# Patient Record
Sex: Male | Born: 1980 | Race: White | Hispanic: No
Health system: Southern US, Academic
[De-identification: ages and names within clinical notes are randomized; demographics above are authoritative.]

---

## 2019-06-30 ENCOUNTER — Other Ambulatory Visit: Payer: Self-pay

## 2019-06-30 ENCOUNTER — Emergency Department
Admission: EM | Admit: 2019-06-30 | Discharge: 2019-07-01 | Disposition: A | Discharge status: Home / Self Care | Payer: Self-pay | Attending: Emergency Medicine | Admitting: Emergency Medicine

## 2019-06-30 ENCOUNTER — Encounter: Payer: Self-pay | Admitting: Emergency Medicine

## 2019-06-30 DIAGNOSIS — Z79899 Other long term (current) drug therapy: Secondary | ICD-10-CM | POA: Insufficient documentation

## 2019-06-30 DIAGNOSIS — M542 Cervicalgia: Secondary | ICD-10-CM | POA: Insufficient documentation

## 2019-06-30 DIAGNOSIS — T7840XA Allergy, unspecified, initial encounter: Secondary | ICD-10-CM | POA: Insufficient documentation

## 2019-06-30 DIAGNOSIS — L03221 Cellulitis of neck: Secondary | ICD-10-CM | POA: Insufficient documentation

## 2019-06-30 DIAGNOSIS — F17200 Nicotine dependence, unspecified, uncomplicated: Secondary | ICD-10-CM | POA: Insufficient documentation

## 2019-06-30 LAB — COMPREHENSIVE METABOLIC PANEL
ALT: 13 U/L (ref 0–44)
AST: 16 U/L (ref 15–41)
Albumin: 4.5 g/dL (ref 3.5–5.0)
Alkaline Phosphatase: 53 U/L (ref 38–126)
Anion gap: 11 (ref 5–15)
BUN: 7 mg/dL (ref 6–20)
CO2: 27 mmol/L (ref 22–32)
Calcium: 9.6 mg/dL (ref 8.9–10.3)
Chloride: 101 mmol/L (ref 98–111)
Creatinine, Ser: 0.78 mg/dL (ref 0.61–1.24)
GFR calc Af Amer: >60 mL/min (ref >60)
GFR calc non Af Amer: >60 mL/min (ref >60)
Glucose, Bld: 96 mg/dL (ref 70–99)
Potassium: 3.5 mmol/L (ref 3.5–5.1)
Sodium: 139 mmol/L (ref 135–145)
Total Bilirubin: 0.8 mg/dL (ref 0.3–1.2)
Total Protein: 8.3 g/dL — ABNORMAL HIGH (ref 6.5–8.1)

## 2019-06-30 LAB — CBC
HCT: 39.6 % (ref 39.0–52.0)
Hemoglobin: 13.5 g/dL (ref 13.0–17.0)
MCH: 31.8 pg (ref 26.0–34.0)
MCHC: 34.1 g/dL (ref 30.0–36.0)
MCV: 93.2 fL (ref 80.0–100.0)
Platelets: 186 10*3/uL (ref 150–400)
RBC: 4.25 MIL/uL (ref 4.22–5.81)
RDW: 12.2 % (ref 11.5–15.5)
WBC: 11.9 10*3/uL — ABNORMAL HIGH (ref 4.0–10.5)
nRBC: 0 % (ref 0.0–0.2)

## 2019-06-30 LAB — LACTIC ACID, PLASMA: Lactic Acid, Venous: 1.4 mmol/L (ref 0.5–1.9)

## 2019-06-30 MED ORDER — HYDROCODONE-ACETAMINOPHEN 5-325 MG PO TABS
2.0000 | ORAL_TABLET | Freq: Once | ORAL | Status: AC
  Administered 2019-07-01: 2 via ORAL
  Filled 2019-06-30: qty 2

## 2019-06-30 MED ORDER — METHYLPREDNISOLONE SODIUM SUCC 125 MG IJ SOLR
125.0000 mg | Freq: Once | INTRAMUSCULAR | Status: AC
  Administered 2019-06-30: 22:00:00 125 mg via INTRAVENOUS
  Filled 2019-06-30: qty 2

## 2019-06-30 MED ORDER — SODIUM CHLORIDE 0.9 % IV BOLUS
1000.0000 mL | Freq: Once | INTRAVENOUS | Status: AC
  Administered 2019-06-30: 1000 mL via INTRAVENOUS

## 2019-06-30 MED ORDER — DIPHENHYDRAMINE HCL 50 MG/ML IJ SOLN
25.0000 mg | Freq: Once | INTRAMUSCULAR | Status: AC
  Administered 2019-06-30: 22:00:00 25 mg via INTRAVENOUS
  Filled 2019-06-30: qty 1

## 2019-06-30 MED ORDER — KETOROLAC TROMETHAMINE 30 MG/ML IJ SOLN
15.0000 mg | Freq: Once | INTRAMUSCULAR | Status: AC
  Administered 2019-06-30: 15 mg via INTRAVENOUS
  Filled 2019-06-30: qty 1

## 2019-06-30 MED ORDER — FAMOTIDINE IN NACL 20-0.9 MG/50ML-% IV SOLN
20.0000 mg | Freq: Once | INTRAVENOUS | Status: AC
  Administered 2019-06-30: 22:00:00 20 mg via INTRAVENOUS
  Filled 2019-06-30: qty 50

## 2019-06-30 MED ORDER — DIPHENHYDRAMINE HCL 50 MG/ML IJ SOLN: 25.0000 mg | Freq: Once | INTRAMUSCULAR | Status: DC

## 2019-06-30 NOTE — ED Notes (Signed)
Spoke with dr. Quentin Cornwall regarding pt complaint. Orders for labs received.

## 2019-06-30 NOTE — ED Notes (Signed)
Pt reports burning to the arm when benadryl given. Pt has whelps that appear over the arm distal to the site. Pt states that burning has improved. Lab unable to get blood cultures at this time.

## 2019-06-30 NOTE — ED Notes (Signed)
Unable to obtain any blood samples from IV. IV team consult placed.

## 2019-06-30 NOTE — ED Notes (Signed)
Attempted to draw blood from pt x2 without success Lattie Haw, RN notified.

## 2019-06-30 NOTE — ED Triage Notes (Addendum)
Pt presents to ED with pain, redness, and swelling to the left side of his neck/clavical area. Pt reports he noticed slight redness and pain with movement upon waking this morning. Pt states by lunch time symptoms had worsened significantly. Unsure if he was stung or bitten by an insect. No obvious knot or abscess noted during triage. Attempted to use benadryl cream and took otc benadryl and tylenol at home without relief.

## 2019-06-30 NOTE — ED Provider Notes (Signed)
[facility] Emergency Department Provider Note  ____________________________________________   First MD Initiated Contact with Patient 06/30/19 2148     (approximate)  I have reviewed the triage vital signs and the nursing notes.   HISTORY  Chief Complaint Neck Pain    HPI Michael Eaton is a 38 y.o. male  Here with swelling and pain to lef tneck. Pt states that he was outside working this morning when he pulled down a wasp nest. He felt a stinging sensation in his left lower neck/shoulder area. Since then, eh has had progressively worsening swelling, redness, and warmth along his left base of the neck. He has had no difficulty breathing, swallowing, or hoarseness. No abd pain, n/v/d. No h/o similar reactions. He states he may have had a small red area in the neck before the bite but eveyrthing was worse after it. No fever, chills, weight loss, night sweats. No h/o MRSA. No other complaitns. Pian is aching, throbbing, worse w/ any movement or palpation. No alleviating factors.        History reviewed. No pertinent past medical history.  There are no active problems to display for this patient.   History reviewed. No pertinent surgical history.  Prior to Admission medications   Medication Sig Start Date End Date Taking? Authorizing Provider  cephALEXin (KEFLEX) 500 MG capsule Take 1 capsule (500 mg total) by mouth 3 (three) times daily for 7 days. 07/01/19 07/08/19  Shaune Pollack, MD  EPINEPHrine 0.3 mg/0.3 mL IJ SOAJ injection Inject 0.3 mLs (0.3 mg total) into the muscle as needed for anaphylaxis. 07/01/19   Shaune Pollack, MD  ibuprofen (ADVIL) 600 MG tablet Take 1 tablet (600 mg total) by mouth every 8 (eight) hours as needed for moderate pain. 07/01/19   Shaune Pollack, MD  sulfamethoxazole-trimethoprim (BACTRIM DS) 800-160 MG tablet Take 1 tablet by mouth 2 (two) times daily for 7 days. 07/01/19 07/08/19  Shaune Pollack, MD  triamcinolone ointment  (KENALOG) 0.1 % Apply 1 application topically 2 (two) times daily for 7 days. 07/01/19 07/08/19  Shaune Pollack, MD    Allergies Patient has no known allergies.  No family history on file.  Social History Social History   Tobacco Use  . Smoking status: Current Some Day Smoker  . Smokeless tobacco: Current User    Types: Snuff  Substance Use Topics  . Alcohol use: Never    Frequency: Never  . Drug use: Never    Review of Systems  Review of Systems  Constitutional: Positive for fatigue. Negative for chills and fever.  HENT: Negative for sore throat.   Respiratory: Negative for shortness of breath.   Cardiovascular: Negative for chest pain.  Gastrointestinal: Negative for abdominal pain.  Genitourinary: Negative for flank pain.  Musculoskeletal: Positive for neck pain.  Skin: Positive for rash. Negative for wound.  Allergic/Immunologic: Negative for immunocompromised state.  Neurological: Negative for weakness and numbness.  Hematological: Does not bruise/bleed easily.     ____________________________________________  PHYSICAL EXAM:      VITAL SIGNS: ED Triage Vitals  Enc Vitals Group     BP 06/30/19 2007 129/68     Pulse Rate 06/30/19 2007 78     Resp 06/30/19 2007 20     Temp 06/30/19 2007 98.5 F (36.9 C)     Temp Source 06/30/19 2007 Oral     SpO2 06/30/19 2007 100 %     Weight 06/30/19 2008 157 lb (71.2 kg)     Height 06/30/19 2008 6' (  1.829 m)     Head Circumference --      Peak Flow --      Pain Score 06/30/19 2007 10     Pain Loc --      Pain Edu? --      Excl. in [place]? --      Physical Exam Vitals signs and nursing note reviewed.  Constitutional:      General: He is not in acute distress.    Appearance: He is well-developed.  HENT:     Head: Normocephalic and atraumatic.  Eyes:     Conjunctiva/sclera: Conjunctivae normal.  Neck:     Musculoskeletal: Neck supple.     Comments: Small bite mark to base of left neck overlying the medial clavicle,  with surrounding erythema and edema. Mild surrounding LAD appreciated. No fluctuance or drainage. Cardiovascular:     Rate and Rhythm: Normal rate and regular rhythm.     Heart sounds: Normal heart sounds. No murmur. No friction rub.  Pulmonary:     Effort: Pulmonary effort is normal. No respiratory distress.     Breath sounds: Normal breath sounds. No wheezing or rales.  Abdominal:     General: There is no distension.     Palpations: Abdomen is soft.     Tenderness: There is no abdominal tenderness.  Skin:    General: Skin is warm.     Capillary Refill: Capillary refill takes less than 2 seconds.  Neurological:     Mental Status: He is alert and oriented to person, place, and time.     Motor: No abnormal muscle tone.       ____________________________________________   LABS (all labs ordered are listed, but only abnormal results are displayed)  Labs Reviewed  COMPREHENSIVE METABOLIC PANEL - Abnormal; Notable for the following components:      Result Value   Total Protein 8.3 (*)    All other components within normal limits  CBC - Abnormal; Notable for the following components:   WBC 11.9 (*)    All other components within normal limits  CULTURE, BLOOD (ROUTINE X 2)  CULTURE, BLOOD (ROUTINE X 2)  LACTIC ACID, PLASMA  LACTIC ACID, PLASMA    ____________________________________________  EKG: None ________________________________________  RADIOLOGY All imaging, including plain films, CT scans, and ultrasounds, independently reviewed by me, and interpretations confirmed via formal radiology reads.  ED MD interpretation:   None  Official radiology report(s): No results found.  ____________________________________________  PROCEDURES   Procedure(s) performed (including Critical Care):  Procedures  ____________________________________________  INITIAL IMPRESSION / MDM / ASSESSMENT AND PLAN / ED COURSE  As part of my medical decision making, I reviewed the  following data within the electronic MEDICAL RECORD NUMBER Notes from prior ED visits and [place] Controlled Substance Database      *Michael Eaton was evaluated in Emergency Department on 07/01/2019 for the symptoms described in the history of present illness. He was evaluated in the context of the global COVID-19 pandemic, which necessitated consideration that the patient might be at risk for infection with the SARS-CoV-2 virus that causes COVID-19. Institutional protocols and algorithms that pertain to the evaluation of patients at risk for COVID-19 are in a state of rapid change based on information released by regulatory bodies including the CDC and federal and state organizations. These policies and algorithms were followed during the patient's care in the ED.  Some ED evaluations and interventions may be delayed as a result of limited staffing during the pandemic.*  Medical Decision Making: 38 yo M here with suspected local allergic reaction to insect sting, with possible superimposed cellulitis though time course is less consistent with this. No fever. Pt is well appearing and non-toxic. No fluctuance or appreciable abscess. VSS. No second system involvement or signs of anaphylaxis. No TTP beyond borders, time course and labs not c/w sepsis or nec fasc. Will treat with topical steroids, brief course of ABX given induration/tenderness, and d/c home.  ____________________________________________  FINAL CLINICAL IMPRESSION(S) / ED DIAGNOSES  Final diagnoses:  Neck pain  Allergic reaction, initial encounter  Cellulitis of neck     MEDICATIONS GIVEN DURING THIS VISIT:  Medications  diphenhydrAMINE (BENADRYL) injection 25 mg (25 mg Intravenous Given 06/30/19 2217)  methylPREDNISolone sodium succinate (SOLU-MEDROL) 125 mg/2 mL injection 125 mg (125 mg Intravenous Given 06/30/19 2217)  sodium chloride 0.9 % bolus 1,000 mL (0 mLs Intravenous Stopped 07/01/19 0050)  ketorolac (TORADOL) 30 MG/ML  injection 15 mg (15 mg Intravenous Given 06/30/19 2217)  famotidine (PEPCID) IVPB 20 mg premix (0 mg Intravenous Stopped 06/30/19 2303)  HYDROcodone-acetaminophen (NORCO/VICODIN) 5-325 MG per tablet 2 tablet (2 tablets Oral Given 07/01/19 0047)     ED Discharge Orders         Ordered    cephALEXin (KEFLEX) 500 MG capsule  3 times daily     07/01/19 0015    sulfamethoxazole-trimethoprim (BACTRIM DS) 800-160 MG tablet  2 times daily     07/01/19 0015    triamcinolone ointment (KENALOG) 0.1 %  2 times daily     07/01/19 0015    ibuprofen (ADVIL) 600 MG tablet  Every 8 hours PRN     07/01/19 0015    EPINEPHrine 0.3 mg/0.3 mL IJ SOAJ injection  As needed     07/01/19 0016           Note:  This document was prepared using Dragon voice recognition software and may include unintentional dictation errors.   Duffy Bruce, MD 07/01/19 [phone]

## 2019-07-01 MED ORDER — TRIAMCINOLONE ACETONIDE 0.1 % EX OINT: 1.0000 "application " | TOPICAL_OINTMENT | Freq: Two times a day (BID) | CUTANEOUS | 0 refills | Status: AC

## 2019-07-01 MED ORDER — IBUPROFEN 600 MG PO TABS: 600.0000 mg | ORAL_TABLET | Freq: Three times a day (TID) | ORAL | 0 refills | Status: AC | PRN

## 2019-07-01 MED ORDER — CEPHALEXIN 500 MG PO CAPS: 500.0000 mg | ORAL_CAPSULE | Freq: Three times a day (TID) | ORAL | 0 refills | Status: AC

## 2019-07-01 MED ORDER — EPINEPHRINE 0.3 MG/0.3ML IJ SOAJ: 0.3000 mg | INTRAMUSCULAR | 0 refills | Status: AC | PRN

## 2019-07-01 MED ORDER — SULFAMETHOXAZOLE-TRIMETHOPRIM 800-160 MG PO TABS: 1.0000 | ORAL_TABLET | Freq: Two times a day (BID) | ORAL | 0 refills | Status: AC

## 2019-07-05 ENCOUNTER — Emergency Department
Admission: EM | Admit: 2019-07-05 | Discharge: 2019-07-05 | Disposition: A | Discharge status: Home / Self Care | Payer: Medicaid Other | Attending: Emergency Medicine | Admitting: Emergency Medicine

## 2019-07-05 ENCOUNTER — Emergency Department: Payer: Medicaid Other

## 2019-07-05 ENCOUNTER — Other Ambulatory Visit: Payer: Self-pay

## 2019-07-05 DIAGNOSIS — F17290 Nicotine dependence, other tobacco product, uncomplicated: Secondary | ICD-10-CM | POA: Insufficient documentation

## 2019-07-05 DIAGNOSIS — L0211 Cutaneous abscess of neck: Secondary | ICD-10-CM | POA: Insufficient documentation

## 2019-07-05 DIAGNOSIS — L0291 Cutaneous abscess, unspecified: Secondary | ICD-10-CM

## 2019-07-05 DIAGNOSIS — L03221 Cellulitis of neck: Secondary | ICD-10-CM | POA: Insufficient documentation

## 2019-07-05 LAB — BASIC METABOLIC PANEL
Anion gap: 11 (ref 5–15)
BUN: 14 mg/dL (ref 6–20)
CO2: 19 mmol/L — ABNORMAL LOW (ref 22–32)
Calcium: 8.9 mg/dL (ref 8.9–10.3)
Chloride: 106 mmol/L (ref 98–111)
Creatinine, Ser: 0.74 mg/dL (ref 0.61–1.24)
GFR calc Af Amer: >60 mL/min (ref >60)
GFR calc non Af Amer: >60 mL/min (ref >60)
Glucose, Bld: 78 mg/dL (ref 70–99)
Potassium: 4.3 mmol/L (ref 3.5–5.1)
Sodium: 136 mmol/L (ref 135–145)

## 2019-07-05 LAB — CBC WITH DIFFERENTIAL/PLATELET
Abs Immature Granulocytes: 0.03 10*3/uL (ref 0.00–0.07)
Basophils Absolute: 0 10*3/uL (ref 0.0–0.1)
Basophils Relative: 0 %
Eosinophils Absolute: 0.1 10*3/uL (ref 0.0–0.5)
Eosinophils Relative: 1 %
HCT: 38.5 % — ABNORMAL LOW (ref 39.0–52.0)
Hemoglobin: 12.9 g/dL — ABNORMAL LOW (ref 13.0–17.0)
Immature Granulocytes: 0 %
Lymphocytes Relative: 15 %
Lymphs Abs: 1.5 10*3/uL (ref 0.7–4.0)
MCH: 31.1 pg (ref 26.0–34.0)
MCHC: 33.5 g/dL (ref 30.0–36.0)
MCV: 92.8 fL (ref 80.0–100.0)
Monocytes Absolute: 1 10*3/uL (ref 0.1–1.0)
Monocytes Relative: 10 %
Neutro Abs: 7.8 10*3/uL — ABNORMAL HIGH (ref 1.7–7.7)
Neutrophils Relative %: 74 %
Platelets: 244 10*3/uL (ref 150–400)
RBC: 4.15 MIL/uL — ABNORMAL LOW (ref 4.22–5.81)
RDW: 12.1 % (ref 11.5–15.5)
WBC: 10.5 10*3/uL (ref 4.0–10.5)
nRBC: 0 % (ref 0.0–0.2)

## 2019-07-05 IMAGING — CT CT NECK W/ CM
4 series · 15 of 33 positions shown, 18 images · IV contrast (omnipaque)
Comparison: None.

CLINICAL DATA: Evaluate abscess left lower neck.  Spider bite.

EXAM:
CT NECK WITH CONTRAST
TECHNIQUE: Multidetector CT imaging of the neck was performed using the
standard protocol following the bolus administration of intravenous
contrast.
CONTRAST:  75mL OMNIPAQUE IOHEXOL 300 MG/ML  SOLN

[Series 2: axial neck · axial · 0.58mm/px · z∈[-363,-179]mm · 5 of 138 slices shown, 7 images]
[im 23/138  soft-tissue]
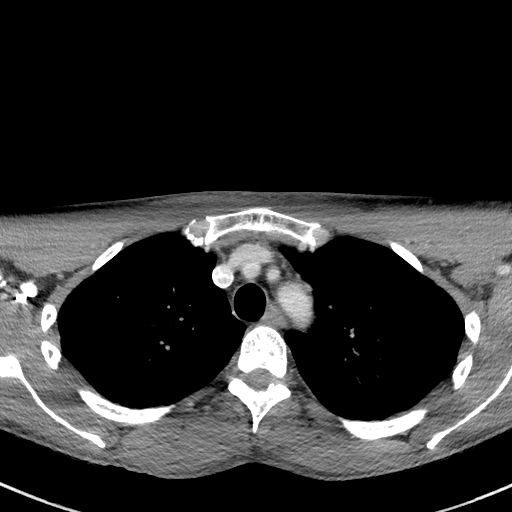
[im 23/138  bone]
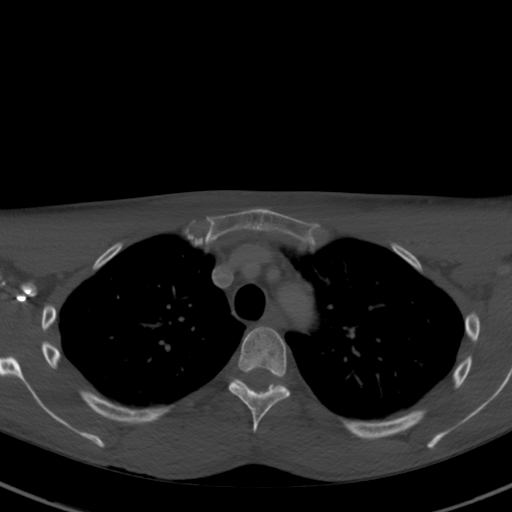
[im 46/138  bone]
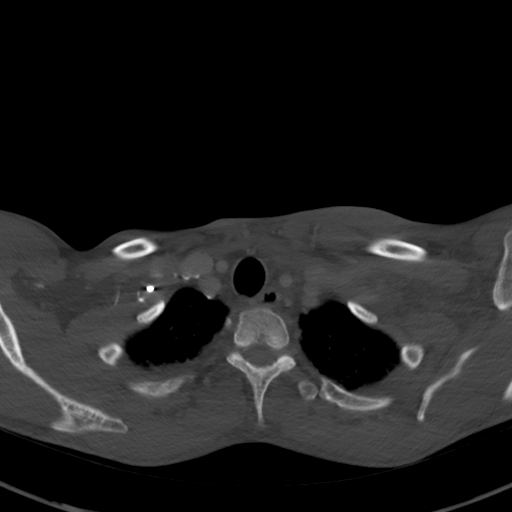
[im 69/138  bone]
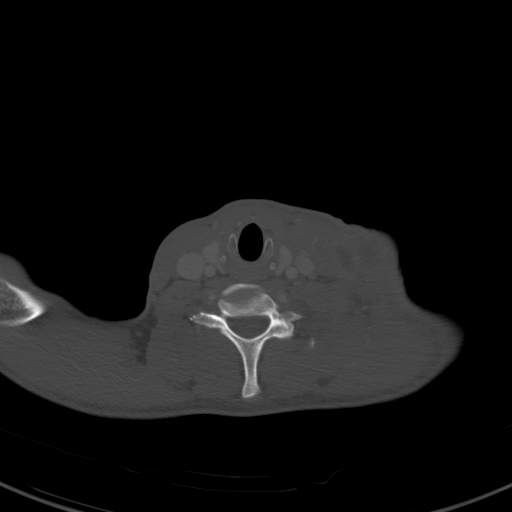
[im 92/138  bone]
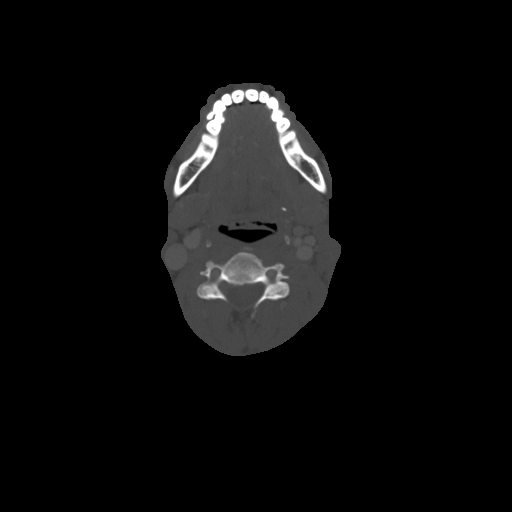
[im 115/138  soft-tissue]
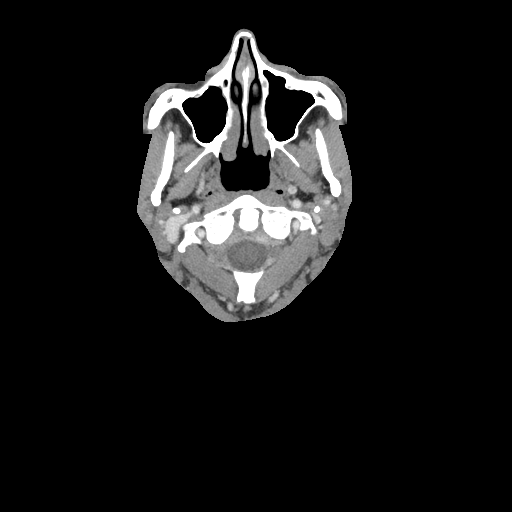
[im 115/138  bone]
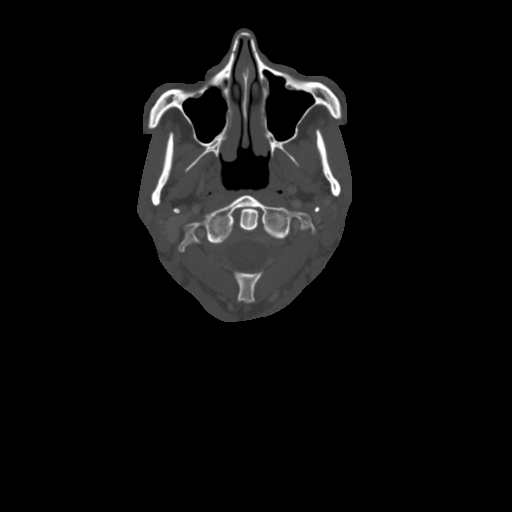

[Series 5: sag neck · sagittal · 0.52mm/px · 5 of 124 slices shown, 6 images]
[im 42/124  bone]
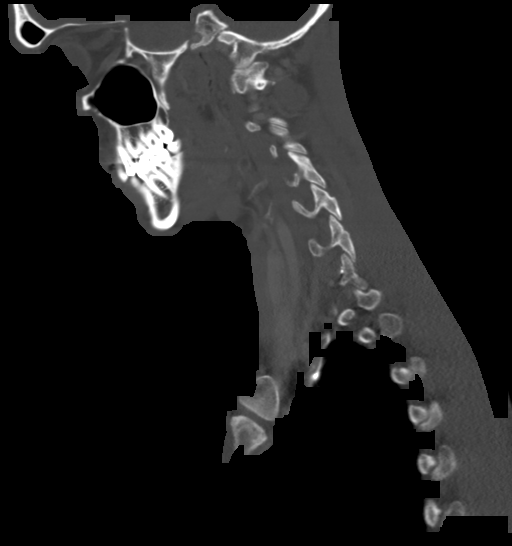
[im 52/124  bone]
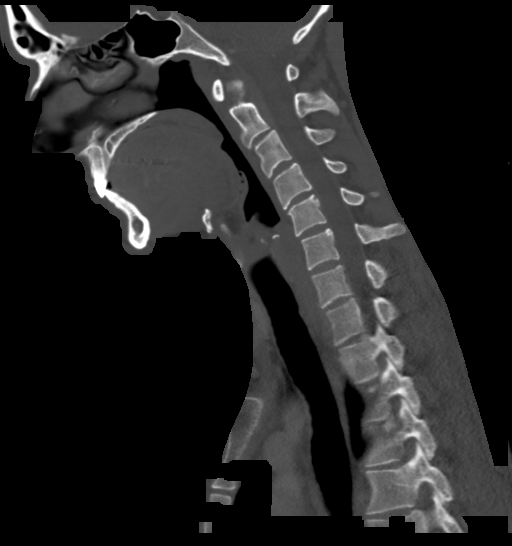
[im 62/124  soft-tissue]
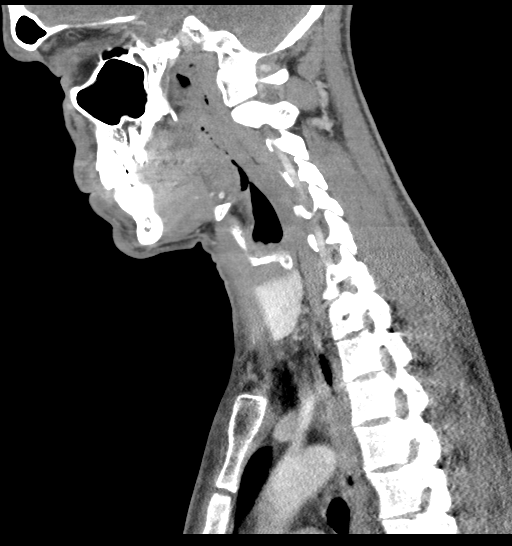
[im 62/124  bone]
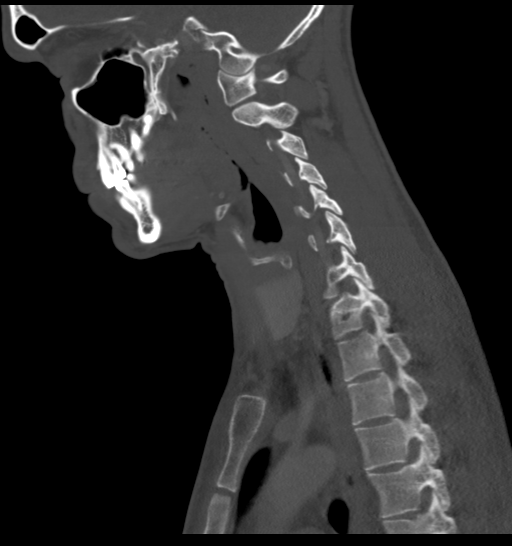
[im 72/124  bone]
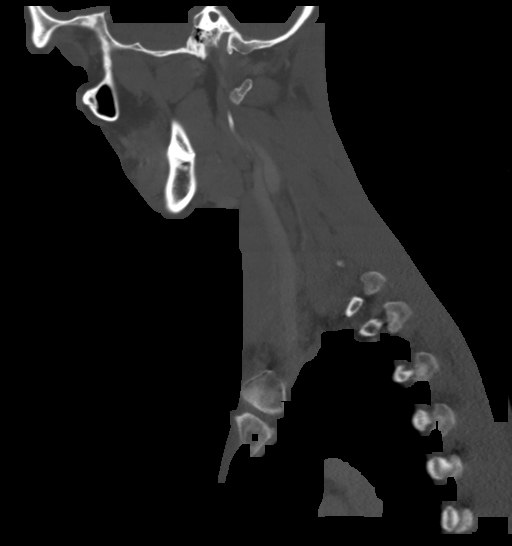
[im 83/124  bone]
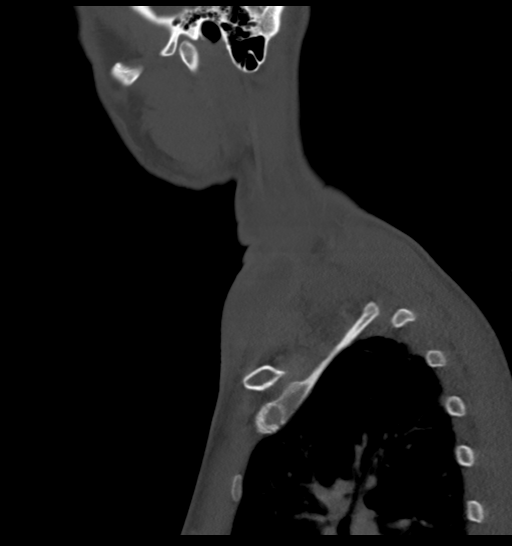

[Series 6: cor neck · coronal · 0.48mm/px · 3 of 135 slices shown]
[im 27/135  bone]
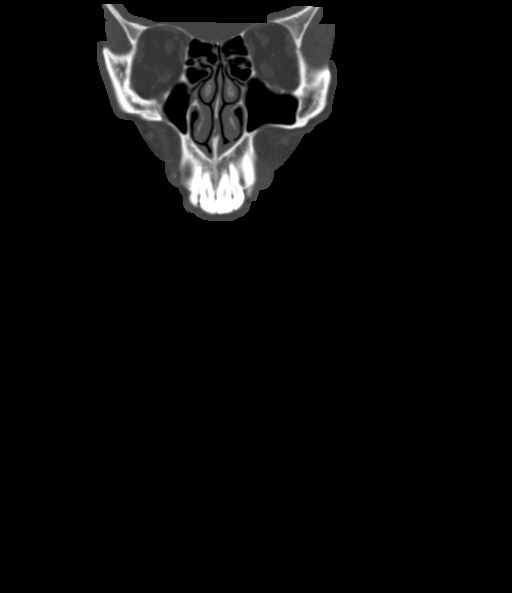
[im 54/135  bone]
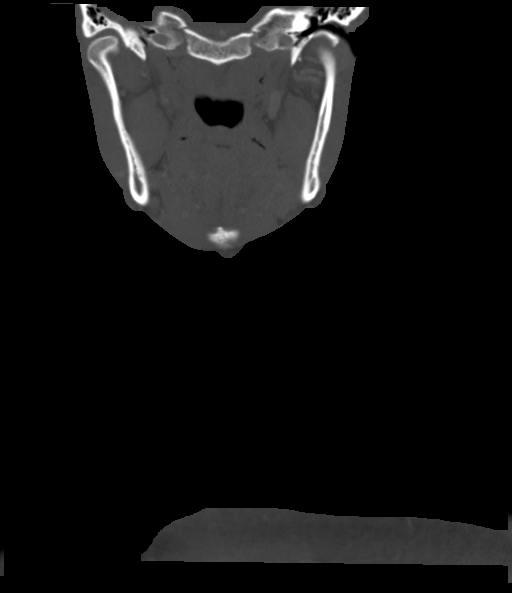
[im 81/135  bone]
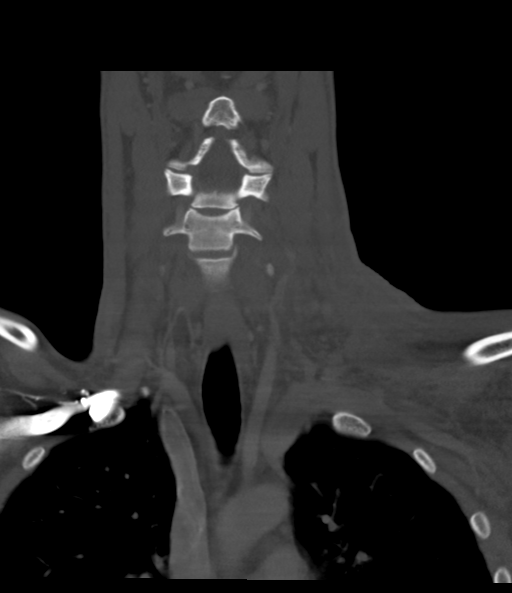

[Series 7: orthogonal ax · axial · 0.48mm/px · z∈[-367,-321]mm · 2 of 141 slices shown]
[im 24/141  bone]
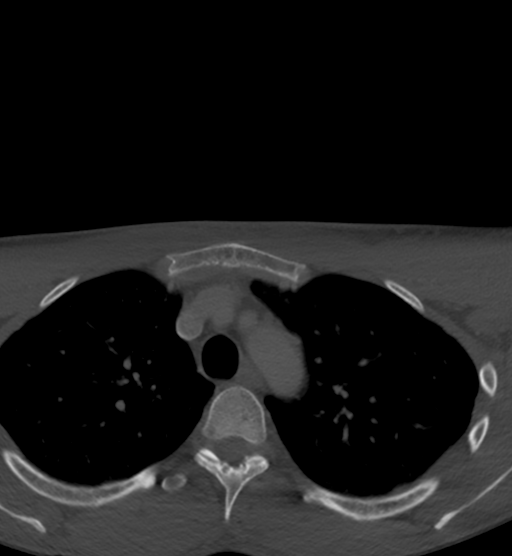
[im 47/141  bone]
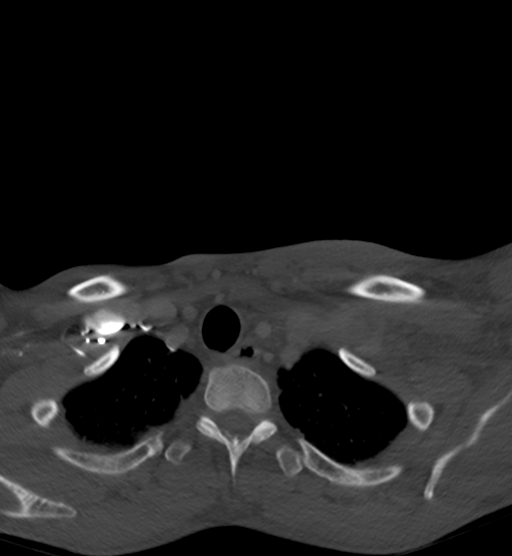

[15 of 33 positions shown; findings below may reference images not displayed]

FINDINGS: Pharynx and larynx: No mucosal or submucosal lesion.

Salivary glands: Parotid and submandibular glands are normal.

Thyroid: Normal

Lymph nodes: No lymphadenopathy.

Vascular: Normal

Limited intracranial: Normal

Visualized orbits: Normal

Mastoids and visualized paranasal sinuses: Clear

Skeleton: Normal

Upper chest: Normal

Other: 5 cm abscess in the left supraclavicular region anteriorly.
Edema of the subcutaneous fat of the left neck consistent with
regional cellulitis. This abscess appears largely contained, lateral
to the sternocleidomastoid muscle, without deep space extension.
IMPRESSION: Anterior left supraclavicular abscess measuring 5 cm in diameter.
This is superficial, tenting the skin. Abscess is lateral to the
sternocleidomastoid muscle and does not show deep space extension.
Regional cellulitis changes without evidence of separate abscess
collection.

## 2019-07-05 MED ORDER — IOHEXOL 300 MG/ML  SOLN
75.0000 mL | Freq: Once | INTRAMUSCULAR | Status: AC | PRN
  Administered 2019-07-05: 16:00:00 75 mL via INTRAVENOUS

## 2019-07-05 MED ORDER — LIDOCAINE-EPINEPHRINE 2 %-1:100000 IJ SOLN
20.0000 mL | Freq: Once | INTRAMUSCULAR | Status: AC
  Administered 2019-07-05: 20 mL via INTRADERMAL
  Filled 2019-07-05: qty 1

## 2019-07-05 MED ORDER — SODIUM CHLORIDE 0.9 % IV BOLUS
1000.0000 mL | Freq: Once | INTRAVENOUS | Status: AC
  Administered 2019-07-05: 1000 mL via INTRAVENOUS

## 2019-07-05 MED ORDER — OXYCODONE-ACETAMINOPHEN 5-325 MG PO TABS: 1.0000 | ORAL_TABLET | ORAL | 0 refills | Status: AC | PRN

## 2019-07-05 MED ORDER — HYDROCODONE-ACETAMINOPHEN 5-325 MG PO TABS
2.0000 | ORAL_TABLET | Freq: Once | ORAL | Status: AC
  Administered 2019-07-05: 18:00:00 2 via ORAL
  Filled 2019-07-05: qty 2

## 2019-07-05 MED ORDER — CLINDAMYCIN HCL 150 MG PO CAPS: 450.0000 mg | ORAL_CAPSULE | Freq: Three times a day (TID) | ORAL | 0 refills | Status: AC

## 2019-07-05 MED ORDER — CLINDAMYCIN PHOSPHATE 600 MG/50ML IV SOLN
600.0000 mg | Freq: Once | INTRAVENOUS | Status: AC
  Administered 2019-07-05: 15:00:00 600 mg via INTRAVENOUS
  Filled 2019-07-05: qty 50

## 2019-07-05 MED ORDER — CLINDAMYCIN HCL 150 MG PO CAPS: 450.0000 mg | ORAL_CAPSULE | Freq: Three times a day (TID) | ORAL | 0 refills | Status: DC

## 2019-07-05 MED ORDER — HYDROMORPHONE HCL 1 MG/ML IJ SOLN
1.0000 mg | Freq: Once | INTRAMUSCULAR | Status: AC
  Administered 2019-07-05: 1 mg via INTRAVENOUS
  Filled 2019-07-05: qty 1

## 2019-07-05 MED ORDER — KETOROLAC TROMETHAMINE 30 MG/ML IJ SOLN
15.0000 mg | Freq: Once | INTRAMUSCULAR | Status: AC
  Administered 2019-07-05: 17:00:00 15 mg via INTRAVENOUS
  Filled 2019-07-05: qty 1

## 2019-07-05 NOTE — ED Provider Notes (Signed)
Assumed care from Dr. Joan Mayans at 3 PM. Briefly, the patient is a 38 y.o. male with PMHx of  has no past medical history on file. here with neck pain and swelling. Pt was actually just seen by myself on 9/22 for allergic rxn vs insect bite/sting. No abscess on bedside U/S at that time and exam was more c/w allergic rxn. Redness has since improved but he has now developed abscess to his neck area. No fevers. WBC downtrending. Labs reassuring, mild dehydration noted. Plan to f/u CT, likely I&D.  Labs Reviewed  BASIC METABOLIC PANEL - Abnormal; Notable for the following components:      Result Value   CO2 19 (*)    All other components within normal limits  CBC WITH DIFFERENTIAL/PLATELET - Abnormal; Notable for the following components:   RBC 4.15 (*)    Hemoglobin 12.9 (*)    HCT 38.5 (*)    Neutro Abs 7.8 (*)    All other components within normal limits    Course of Care:  Marland KitchenMarland KitchenIncision and Drainage  Date/Time: 07/05/2019 5:47 PM Performed by: Duffy Bruce, MD Authorized by: Duffy Bruce, MD   Consent:    Consent obtained:  Verbal   Consent given by:  Patient   Risks discussed:  Bleeding, damage to other organs, incomplete drainage, infection and pain   Alternatives discussed:  Alternative treatment and delayed treatment Location:    Type:  Abscess   Size:  5 x 4   Location:  Neck   Neck location:  L anterior Pre-procedure details:    Skin preparation:  Betadine Anesthesia (see MAR for exact dosages):    Anesthesia method:  Local infiltration   Local anesthetic:  Lidocaine 1% WITH epi Procedure type:    Complexity:  Complex Procedure details:    Incision types:  Single straight   Incision depth:  Dermal   Scalpel blade:  11   Wound management:  Probed and deloculated and irrigated with saline   Drainage:  Purulent   Drainage amount:  Copious   Wound treatment:  Drain placed Post-procedure details:    Patient tolerance of procedure:  Tolerated well, no immediate  complications   -CT scan shows superficial abscess.  No evidence of deep tissue involvement.  I discussed the case with ENT on-call, who recommends bedside I&D by ED provider.  I had a long discussion with risk and benefits discussed with the patient, and decision made to drain at bedside.  I was able to drain approximately 40 to 50 cc of purulent material, which was sent for culture.  A second incision was made along the length of the abscess, and a Penrose was placed to help facilitate drainage.  Will switch the patient to clindamycin, follow-up culture, and have him return in 24 to 40 hours for wound check and repeat evaluation.  Return precautions given.     Duffy Bruce, MD 07/05/19 [phone]

## 2019-07-05 NOTE — ED Triage Notes (Signed)
Pt presents via POV c/o infection to left side of neck s/p spider bite. On PO abx without improvement. Reports associated chest pain and throat pain.

## 2019-07-05 NOTE — ED Provider Notes (Signed)
[facility] Emergency Department Provider Note  ____________________________________________   First MD Initiated Contact with Patient 07/05/19 1324     (approximate)  I have reviewed the triage vital signs and the nursing notes.  History  Chief Complaint Abscess    HPI Michael Eaton is a 38 y.o. male with no significant past medical history who presents emergency department for evaluation of potential abscess.  Patient states he thinks he was bit by an insect to the left lower neck area on 9/22.  He was seen here, and treated for an allergic reaction and given a course of antibiotics.  He reports compliance with his antibiotics. He states the initial, large surrounding redness improved. However, he states despite the antibiotics he has developed increased focal, localized swelling, erythema, fluctuance to the suspected bite area. Area is about the size of an egg.  No active drainage.  No fevers, nausea, vomiting.  He denies any weakness, numbness, tingling of his arm.   Past Medical Hx History reviewed. No pertinent past medical history.  Problem List There are no active problems to display for this patient.   Past Surgical Hx History reviewed. No pertinent surgical history.  Medications Prior to Admission medications   Medication Sig Start Date End Date Taking? Authorizing Provider  cephALEXin (KEFLEX) 500 MG capsule Take 1 capsule (500 mg total) by mouth 3 (three) times daily for 7 days. 07/01/19 07/08/19  Shaune Pollack, MD  EPINEPHrine 0.3 mg/0.3 mL IJ SOAJ injection Inject 0.3 mLs (0.3 mg total) into the muscle as needed for anaphylaxis. 07/01/19   Shaune Pollack, MD  ibuprofen (ADVIL) 600 MG tablet Take 1 tablet (600 mg total) by mouth every 8 (eight) hours as needed for moderate pain. 07/01/19   Shaune Pollack, MD  sulfamethoxazole-trimethoprim (BACTRIM DS) 800-160 MG tablet Take 1 tablet by mouth 2 (two) times daily for 7 days. 07/01/19 07/08/19   Shaune Pollack, MD  triamcinolone ointment (KENALOG) 0.1 % Apply 1 application topically 2 (two) times daily for 7 days. 07/01/19 07/08/19  Shaune Pollack, MD    Allergies Patient has no known allergies.  Family Hx History reviewed. No pertinent family history.  Social Hx Social History   Tobacco Use  . Smoking status: Current Some Day Smoker  . Smokeless tobacco: Current User    Types: Snuff  Substance Use Topics  . Alcohol use: Never    Frequency: Never  . Drug use: Never     Review of Systems  Constitutional: Negative for fever, chills. Eyes: Negative for visual changes. ENT: Negative for sore throat. Cardiovascular: Negative for chest pain. Respiratory: Negative for shortness of breath. Gastrointestinal: Negative for nausea, vomiting.  Genitourinary: Negative for dysuria. Musculoskeletal: Negative for leg swelling. Skin: + abscess Neurological: Negative for for headaches.   Physical Exam  Vital Signs: ED Triage Vitals  Enc Vitals Group     BP 07/05/19 1313 (!) 151/108     Pulse Rate 07/05/19 1313 79     Resp 07/05/19 1313 18     Temp 07/05/19 1313 98.4 F (36.9 C)     Temp Source 07/05/19 1313 Oral     SpO2 07/05/19 1313 100 %     Weight 07/05/19 1325 155 lb (70.3 kg)     Height --      Head Circumference --      Peak Flow --      Pain Score --      Pain Loc --      Pain Edu? --  Excl. in [place]? --     Constitutional: Alert and oriented.  Head: Normocephalic. Atraumatic. Eyes: Conjunctivae clear. Sclera anicteric. Nose: No congestion. No rhinorrhea. Mouth/Throat: Mucous membranes are moist.  Neck: No stridor.  See skin below. Cardiovascular: Normal rate, regular rhythm. Extremities well perfused.  2+ symmetric radial pulses. Respiratory: Normal respiratory effort.  Lungs CTAB. Gastrointestinal: Soft. Non-tender. Non-distended.  Musculoskeletal:  BUE motor/sensation intact in radial, ulnar, median distribution. Neurologic:  Normal speech and  language. No gross focal neurologic deficits are appreciated.  Skin: Abscess to the left lower neck area, superior to the clavicle, about 3 x 6 cm, erythematous, warm, tender, fluctuant with some surrounding erythema. Psychiatric: Mood and affect are appropriate for situation.  EKG  N/A    Radiology  CT neck, pending.   Procedures  Procedure(s) performed (including critical care):  Procedures   Initial Impression / Assessment and Plan / ED Course  38 y.o. male who presents to the ED for abscess to the left lower neck area after an insect bite, as above.   Will obtain basic labs, give dose of IV antibiotics.  Will obtain imaging to rule out any deeper space infection, if negative will plan for I&D.  Patient is agreeable.    Final Clinical Impression(s) / ED Diagnosis  Final diagnoses:  Abscess       Note:  This document was prepared using Dragon voice recognition software and may include unintentional dictation errors.   Lilia Pro., MD 07/05/19 1539

## 2019-07-05 NOTE — ED Notes (Signed)
Pt verbalized understanding of discharge instructions. NAD at this time. 

## 2019-07-05 NOTE — Discharge Instructions (Signed)
I have prescribed a new antibiotic.  Start taking the clindamycin and you can stop the Keflex and Bactrim.  You can apply warm compress to the area and gently massage it to help with drainage.  It is okay to shower but keep the wound covered when out.  Take the pain medication prescribed.  You can take over-the-counter ibuprofen 600 mg every 8 hours for moderate pain.  It is critically important that you follow-up in the ER in 48 hours for a wound check.

## 2019-07-08 ENCOUNTER — Other Ambulatory Visit: Payer: Self-pay

## 2019-07-08 ENCOUNTER — Encounter: Payer: Self-pay | Admitting: Emergency Medicine

## 2019-07-08 DIAGNOSIS — Z5321 Procedure and treatment not carried out due to patient leaving prior to being seen by health care provider: Secondary | ICD-10-CM | POA: Insufficient documentation

## 2019-07-08 LAB — AEROBIC CULTURE W GRAM STAIN (SUPERFICIAL SPECIMEN): Special Requests: NORMAL

## 2019-07-08 NOTE — ED Triage Notes (Signed)
Patient ambulatory to triage with steady gait, without difficulty or distress noted, mask in place pt reports here drain removal of left neck abscess

## 2019-07-09 ENCOUNTER — Emergency Department
Admission: EM | Admit: 2019-07-09 | Discharge: 2019-07-09 | Discharge status: Against Medical Advice | Payer: Medicaid Other | Attending: Emergency Medicine | Admitting: Emergency Medicine

## 2019-07-09 ENCOUNTER — Emergency Department
Admission: EM | Admit: 2019-07-09 | Discharge: 2019-07-09 | Disposition: A | Discharge status: Home / Self Care | Payer: Medicaid Other | Source: Home / Self Care

## 2019-07-11 ENCOUNTER — Other Ambulatory Visit: Payer: Self-pay

## 2019-07-11 ENCOUNTER — Emergency Department
Admission: EM | Admit: 2019-07-11 | Discharge: 2019-07-11 | Disposition: A | Discharge status: Home / Self Care | Payer: Medicaid Other | Attending: Emergency Medicine | Admitting: Emergency Medicine

## 2019-07-11 DIAGNOSIS — Z5189 Encounter for other specified aftercare: Secondary | ICD-10-CM

## 2019-07-11 DIAGNOSIS — L0211 Cutaneous abscess of neck: Secondary | ICD-10-CM | POA: Insufficient documentation

## 2019-07-11 DIAGNOSIS — Z4803 Encounter for change or removal of drains: Secondary | ICD-10-CM | POA: Insufficient documentation

## 2019-07-11 DIAGNOSIS — F17200 Nicotine dependence, unspecified, uncomplicated: Secondary | ICD-10-CM | POA: Insufficient documentation

## 2019-07-11 DIAGNOSIS — F17228 Nicotine dependence, chewing tobacco, with other nicotine-induced disorders: Secondary | ICD-10-CM | POA: Insufficient documentation

## 2019-07-11 NOTE — ED Notes (Signed)
Pt reports having a spider bite that had a drain placed inside here at the ER 6 days ago. Pt reports it was suppose to come out after 2 days but the wait was too long when he came after work to get it removed.

## 2019-07-11 NOTE — Discharge Instructions (Signed)
Use antibacterial soap as directed.  Continue previous medications.

## 2019-07-11 NOTE — ED Provider Notes (Signed)
[facility] Emergency Department Provider Note   ____________________________________________   None    (approximate)  I have reviewed the triage vital signs and the nursing notes.   HISTORY  Chief Complaint Wound Recheck    HPI Michael Eaton is a 38 y.o. male patient presents for wound check secondary to his abscess left lateral inferior neck area.  I was I&D and a Penrose drain placed.  Patient is here for Penrose drain removal.  Patient stated no complaint at this time.         No past medical history on file.  There are no active problems to display for this patient.   No past surgical history on file.  Prior to Admission medications   Medication Sig Start Date End Date Taking? Authorizing Provider  clindamycin (CLEOCIN) 150 MG capsule Take 3 capsules (450 mg total) by mouth 3 (three) times daily for 7 days. 07/05/19 07/12/19  Duffy Bruce, MD  EPINEPHrine 0.3 mg/0.3 mL IJ SOAJ injection Inject 0.3 mLs (0.3 mg total) into the muscle as needed for anaphylaxis. 07/01/19   Duffy Bruce, MD  ibuprofen (ADVIL) 600 MG tablet Take 1 tablet (600 mg total) by mouth every 8 (eight) hours as needed for moderate pain. 07/01/19   Duffy Bruce, MD  oxyCODONE-acetaminophen (PERCOCET) 5-325 MG tablet Take 1-2 tablets by mouth every 4 (four) hours as needed for moderate pain or severe pain. 07/05/19 07/04/20  Duffy Bruce, MD    Allergies Patient has no known allergies.  No family history on file.  Social History Social History   Tobacco Use  . Smoking status: Current Some Day Smoker  . Smokeless tobacco: Current User    Types: Snuff  Substance Use Topics  . Alcohol use: Never    Frequency: Never  . Drug use: Never    Review of Systems  Constitutional: No fever/chills Eyes: No visual changes. ENT: No sore throat. Cardiovascular: Denies chest pain. Respiratory: Denies shortness of breath. Gastrointestinal: No abdominal pain.  No  nausea, no vomiting.  No diarrhea.  No constipation. Genitourinary: Negative for dysuria. Musculoskeletal: Negative for back pain. Skin:Abscess left lateral inferior neck area. Neurological: Negative for headaches, focal weakness or numbness.   ____________________________________________   PHYSICAL EXAM:  VITAL SIGNS: ED Triage Vitals  Enc Vitals Group     BP 07/11/19 0648 131/82     Pulse Rate 07/11/19 0648 87     Resp 07/11/19 0648 18     Temp 07/11/19 0648 98 F (36.7 C)     Temp Source 07/11/19 0648 Oral     SpO2 07/11/19 0648 100 %     Weight 07/11/19 0649 157 lb (71.2 kg)     Height 07/11/19 0649 (!) 6" (0.152 m)     Head Circumference --      Peak Flow --      Pain Score --      Pain Loc --      Pain Edu? --      Excl. in [place]? --    Constitutional: Alert and oriented. Well appearing and in no acute distress. Hematological/Lymphatic/Immunilogical: No cervical lymphadenopathy. Cardiovascular: Normal rate, regular rhythm. Grossly normal heart sounds.  Good peripheral circulation. Respiratory: Normal respiratory effort.  No retractions. Lungs CTAB. Skin: Mild erythema with Penrose drain in place.  No drainage. Psychiatric: Mood and affect are normal. Speech and behavior are normal.  ____________________________________________   LABS (all labs ordered are listed, but only abnormal results are displayed)  Labs Reviewed -  No data to display ____________________________________________  EKG   ____________________________________________  RADIOLOGY  ED MD interpretation:    Official radiology report(s): No results found.  ____________________________________________   PROCEDURES  Procedure(s) performed (including Critical Care):  Procedures   ____________________________________________   INITIAL IMPRESSION / ASSESSMENT AND PLAN / ED COURSE  As part of my medical decision making, I reviewed the following data within the electronic medical  record:          Michael Eaton was evaluated in Emergency Department on 07/11/2019 for the symptoms described in the history of present illness. He was evaluated in the context of the global COVID-19 pandemic, which necessitated consideration that the patient might be at risk for infection with the SARS-CoV-2 virus that causes COVID-19. Institutional protocols and algorithms that pertain to the evaluation of patients at risk for COVID-19 are in a state of rapid change based on information released by regulatory bodies including the CDC and federal and state organizations. These policies and algorithms were followed during the patient's care in the ED.  Patient presents for wound recheck requiring removal of Penrose drain.  Patient tolerated removal without incident.  Patient given discharge care instruction advised continue previous medications.  Follow-up this department as needed.      ____________________________________________   FINAL CLINICAL IMPRESSION(S) / ED DIAGNOSES  Final diagnoses:  Encounter for wound re-check     ED Discharge Orders    None       Note:  This document was prepared using Dragon voice recognition software and may include unintentional dictation errors.    Joni Reining, PA-C 07/11/19 [id number]    Dionne Bucy, MD 07/11/19 [phone]

## 2019-07-11 NOTE — ED Triage Notes (Signed)
Patient had area on left neck drained and was supposed to have drain removed on Wednesday, but the wait was too long so here now to have are recheck because thinks it might be infected.

## 2020-05-14 ENCOUNTER — Emergency Department
Admission: EM | Admit: 2020-05-14 | Discharge: 2020-05-14 | Disposition: A | Discharge status: Home / Self Care | Payer: 59 | Attending: Physician Assistant | Admitting: Physician Assistant

## 2020-05-14 ENCOUNTER — Emergency Department (HOSPITAL_COMMUNITY)
Admission: RE | Admit: 2020-05-14 | Discharge: 2020-05-14 | Disposition: A | Discharge status: Home / Self Care | Payer: 59 | Source: Ambulatory Visit

## 2020-05-14 ENCOUNTER — Other Ambulatory Visit: Payer: Self-pay

## 2020-05-14 DIAGNOSIS — S50811A Abrasion of right forearm, initial encounter: Secondary | ICD-10-CM

## 2020-05-14 DIAGNOSIS — S60511A Abrasion of right hand, initial encounter: Secondary | ICD-10-CM | POA: Insufficient documentation

## 2020-05-14 DIAGNOSIS — S51012A Laceration without foreign body of left elbow, initial encounter: Secondary | ICD-10-CM

## 2020-05-14 DIAGNOSIS — S51002A Unspecified open wound of left elbow, initial encounter: Secondary | ICD-10-CM | POA: Insufficient documentation

## 2020-05-14 MED ORDER — CEPHALEXIN 500 MG CAPSULE
500.0000 mg | ORAL_CAPSULE | Freq: Four times a day (QID) | ORAL | 0 refills | Status: AC
Start: 2020-05-14 — End: 2020-05-24
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
## 2020-05-14 NOTE — ED Nurses Note (Signed)
Patient discharged home with family.  AVS reviewed with patient/care giver.  A written copy of the AVS and discharge instructions was given to the patient/care giver.  Questions sufficiently answered as needed.  Patient/care giver encouraged to follow up with PCP as indicated.  In the event of an emergency, patient/care giver instructed to call 911 or go to the nearest emergency room.

## 2020-05-14 NOTE — Discharge Instructions (Signed)
You are being discharged home with Keflex.   Please take medication as directed.    Please follow-up with your PCP.   Return to the ED for any new or worsening symptoms.  Thank you so much for your patience today!
